# Patient Record
Sex: Male | Born: 1940 | Race: White | Hispanic: No | Marital: Married
Health system: Southern US, Community
[De-identification: ages and names within clinical notes are randomized; demographics above are authoritative.]

---

## 2004-11-15 ENCOUNTER — Ambulatory Visit: Payer: Self-pay | Admitting: Neurology

## 2004-11-19 ENCOUNTER — Ambulatory Visit: Payer: Self-pay | Admitting: Cardiology

## 2005-09-17 ENCOUNTER — Emergency Department: Payer: Self-pay | Admitting: Emergency Medicine

## 2005-11-16 ENCOUNTER — Other Ambulatory Visit: Payer: Self-pay

## 2005-11-16 ENCOUNTER — Inpatient Hospital Stay: Payer: Self-pay | Admitting: Internal Medicine

## 2005-11-22 ENCOUNTER — Ambulatory Visit: Payer: Self-pay | Admitting: Internal Medicine

## 2006-02-11 ENCOUNTER — Encounter: Payer: Self-pay | Admitting: Internal Medicine

## 2006-03-07 ENCOUNTER — Encounter: Payer: Self-pay | Admitting: Internal Medicine

## 2006-04-07 ENCOUNTER — Encounter: Payer: Self-pay | Admitting: Internal Medicine

## 2006-05-07 ENCOUNTER — Encounter: Payer: Self-pay | Admitting: Internal Medicine

## 2006-05-31 ENCOUNTER — Other Ambulatory Visit: Payer: Self-pay

## 2006-05-31 ENCOUNTER — Observation Stay: Payer: Self-pay | Admitting: Internal Medicine

## 2006-06-07 ENCOUNTER — Encounter: Payer: Self-pay | Admitting: Internal Medicine

## 2009-06-06 ENCOUNTER — Ambulatory Visit: Payer: Self-pay | Admitting: Oncology

## 2009-06-25 ENCOUNTER — Ambulatory Visit: Payer: Self-pay | Admitting: Unknown Physician Specialty

## 2009-06-26 ENCOUNTER — Ambulatory Visit: Payer: Self-pay | Admitting: Unknown Physician Specialty

## 2009-07-04 ENCOUNTER — Ambulatory Visit: Payer: Self-pay | Admitting: Radiation Oncology

## 2009-07-06 ENCOUNTER — Ambulatory Visit: Payer: Self-pay | Admitting: Oncology

## 2009-07-10 ENCOUNTER — Ambulatory Visit: Payer: Self-pay | Admitting: Surgery

## 2009-07-11 ENCOUNTER — Ambulatory Visit: Payer: Self-pay | Admitting: Oncology

## 2009-08-06 ENCOUNTER — Ambulatory Visit: Payer: Self-pay | Admitting: Oncology

## 2009-09-06 ENCOUNTER — Ambulatory Visit: Payer: Self-pay | Admitting: Oncology

## 2009-09-08 ENCOUNTER — Inpatient Hospital Stay: Payer: Self-pay | Admitting: Internal Medicine

## 2009-10-06 ENCOUNTER — Ambulatory Visit: Payer: Self-pay | Admitting: Oncology

## 2009-10-29 ENCOUNTER — Ambulatory Visit: Payer: Self-pay | Admitting: Unknown Physician Specialty

## 2009-11-06 ENCOUNTER — Ambulatory Visit: Payer: Self-pay | Admitting: Oncology

## 2009-11-07 ENCOUNTER — Ambulatory Visit: Payer: Self-pay | Admitting: Internal Medicine

## 2009-12-06 ENCOUNTER — Ambulatory Visit: Payer: Self-pay | Admitting: Oncology

## 2009-12-17 ENCOUNTER — Ambulatory Visit: Payer: Self-pay | Admitting: Unknown Physician Specialty

## 2009-12-19 LAB — PATHOLOGY REPORT

## 2010-01-06 ENCOUNTER — Ambulatory Visit: Payer: Self-pay | Admitting: Oncology

## 2010-01-23 ENCOUNTER — Ambulatory Visit: Payer: Self-pay | Admitting: Oncology

## 2010-02-06 ENCOUNTER — Ambulatory Visit: Payer: Self-pay | Admitting: Oncology

## 2010-03-07 ENCOUNTER — Ambulatory Visit: Payer: Self-pay | Admitting: Oncology

## 2010-04-09 ENCOUNTER — Ambulatory Visit: Payer: Self-pay | Admitting: Oncology

## 2010-05-07 ENCOUNTER — Ambulatory Visit: Payer: Self-pay | Admitting: Oncology

## 2010-05-24 ENCOUNTER — Telehealth: Payer: Self-pay

## 2010-05-24 DIAGNOSIS — C159 Malignant neoplasm of esophagus, unspecified: Secondary | ICD-10-CM

## 2010-05-24 NOTE — Telephone Encounter (Signed)
It is ok for him to stay on coumadin for the procedure

## 2010-05-24 NOTE — Telephone Encounter (Addendum)
Pt scheduled for EUS need to review meds and instruct pt  Left message on machine to call back

## 2010-05-24 NOTE — Telephone Encounter (Signed)
Pt scheduled for EUS on 06/27/10 1 pm [name].  Pt needs to pre register before the appt and call 06/26/10 between 1-3 for arrival time.  Need to review meds and instruct pt Neil Brown is opening spot for me to add to EP[facility].   Dr Christella Hartigan this pt is on Coumadin and Plavix are they to stay on them or hold?

## 2010-05-27 NOTE — Telephone Encounter (Signed)
Dr Christella Hartigan the pt reports he is also on Plavix and has a defibrillator.  He is concerned  about  staying on them and having a biopsy done.  Please advise.

## 2010-05-28 NOTE — Telephone Encounter (Signed)
That is all OK,  It he will not be getting FNA or biopsies.  This is just to restage an esophageal cancer

## 2010-05-28 NOTE — Telephone Encounter (Signed)
Pt aware.

## 2010-06-07 ENCOUNTER — Ambulatory Visit: Payer: Self-pay | Admitting: Oncology

## 2010-06-11 ENCOUNTER — Ambulatory Visit: Payer: Self-pay | Admitting: Unknown Physician Specialty

## 2010-06-11 ENCOUNTER — Telehealth: Payer: Self-pay | Admitting: Gastroenterology

## 2010-06-11 NOTE — Telephone Encounter (Signed)
appt cx Nicki [place] at [facility] aware

## 2010-06-11 NOTE — Telephone Encounter (Signed)
Just talked with Dr. Mechele Collin, pt has clear cancer recurrence by EGD today (nearly obstructing).No need for upcoming EUS.  He may require palliative stent placement which I am happy to do (at [facility]). Dr. Mechele Collin will be talking with Dr. Cleotis Lema and will get back to me.

## 2010-06-18 ENCOUNTER — Ambulatory Visit: Payer: Self-pay | Admitting: Oncology

## 2010-06-19 LAB — PATHOLOGY REPORT

## 2010-06-25 ENCOUNTER — Inpatient Hospital Stay: Payer: Self-pay | Admitting: Oncology

## 2010-06-27 ENCOUNTER — Encounter: Payer: Self-pay | Admitting: Gastroenterology

## 2010-07-07 ENCOUNTER — Ambulatory Visit: Payer: Self-pay | Admitting: Oncology

## 2010-08-07 ENCOUNTER — Ambulatory Visit: Payer: Self-pay | Admitting: Oncology

## 2010-08-11 ENCOUNTER — Inpatient Hospital Stay: Payer: Self-pay | Admitting: Internal Medicine

## 2010-08-24 ENCOUNTER — Inpatient Hospital Stay: Payer: Self-pay | Admitting: Unknown Physician Specialty

## 2010-08-29 ENCOUNTER — Ambulatory Visit: Payer: Self-pay | Admitting: Gastroenterology

## 2010-08-30 ENCOUNTER — Inpatient Hospital Stay: Payer: Self-pay | Admitting: Internal Medicine

## 2010-09-07 ENCOUNTER — Ambulatory Visit: Payer: Self-pay | Admitting: Oncology

## 2010-10-07 ENCOUNTER — Ambulatory Visit: Payer: Self-pay | Admitting: Oncology

## 2010-11-07 ENCOUNTER — Ambulatory Visit: Payer: Self-pay | Admitting: Oncology

## 2010-12-07 ENCOUNTER — Ambulatory Visit: Payer: Self-pay | Admitting: Oncology

## 2010-12-19 ENCOUNTER — Observation Stay: Payer: Self-pay | Admitting: Internal Medicine

## 2010-12-23 ENCOUNTER — Ambulatory Visit: Payer: Self-pay | Admitting: Gastroenterology

## 2010-12-26 ENCOUNTER — Ambulatory Visit: Payer: Self-pay | Admitting: Unknown Physician Specialty

## 2011-01-07 ENCOUNTER — Ambulatory Visit: Payer: Self-pay | Admitting: Oncology

## 2011-02-07 DEATH — deceased

## 2011-03-07 DEATH — deceased

## 2011-09-02 IMAGING — CT CT CHEST-ABD W/ CM
1 of 3 series · 13 of 32 positions shown, 18 images · non-contrast
Comparison: None

REASON FOR EXAM: esophageal ulcer   eval malignancy
COMMENTS:

PROCEDURE:     CT  - CT CHEST AND ABDOMEN W  - June 26, 2009  [DATE]
RESULT:     CT CHEST AND ABDOMEN
HISTORY: Esophageal ulcer
TECHNIQUE: Multiple axial images obtained from the thoracic inlet to the
iliac crests, with p.o. contrast and with 100 ml of Dsovue-CHO intravenous
contrast.

[Series 2: soft tissue · axial · 0.79mm/px · z∈[+178,+573]mm · 13 of 95 slices shown, 18 images]
[im 8/95  mediastinal]
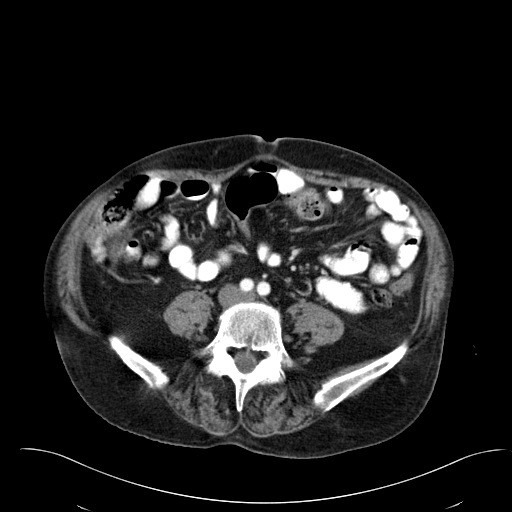
[im 8/95  bone]
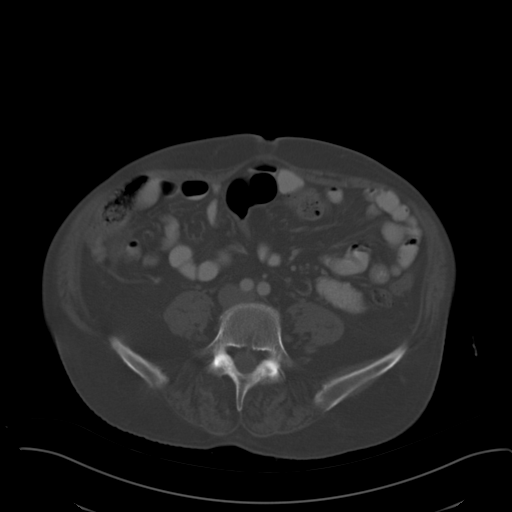
[im 15/95  mediastinal]
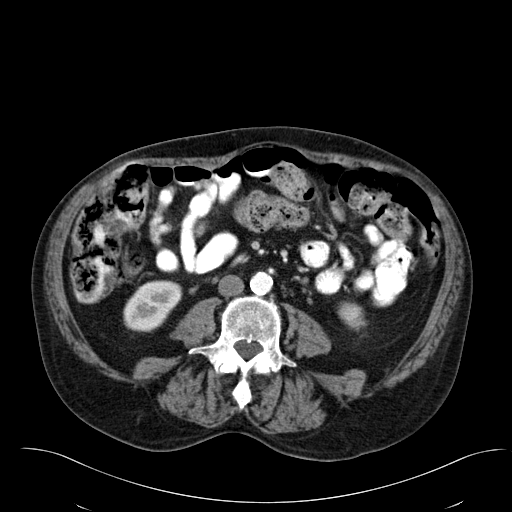
[im 22/95  mediastinal]
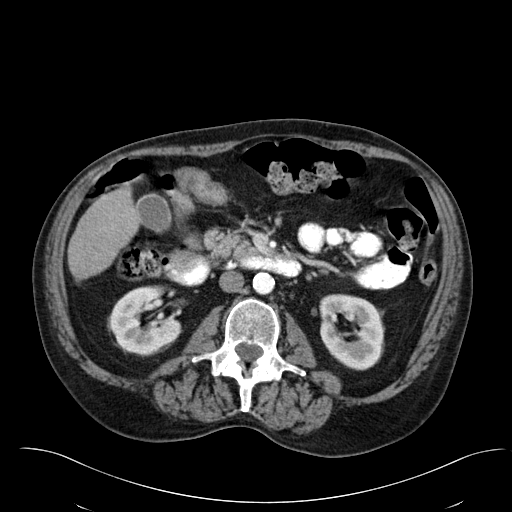
[im 37/95  mediastinal]
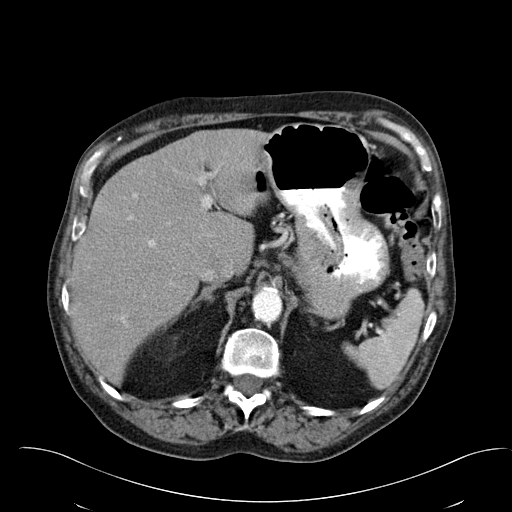
[im 44/95  mediastinal]
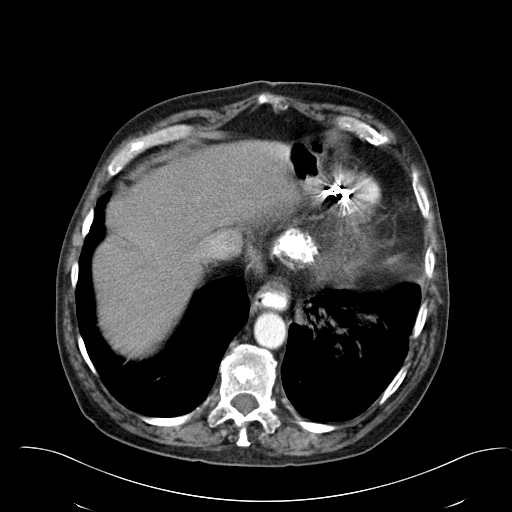
[im 46/95  mediastinal]
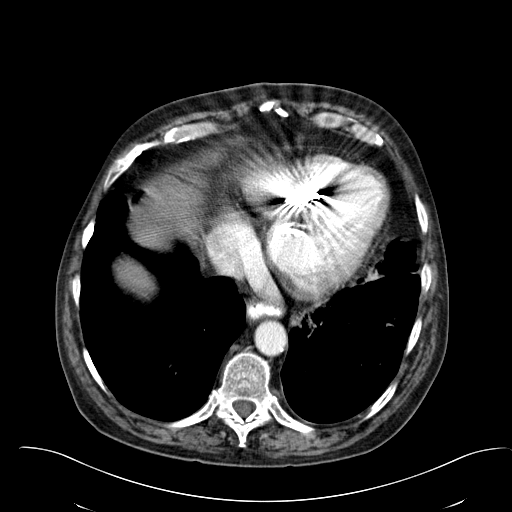
[im 48/95  mediastinal]
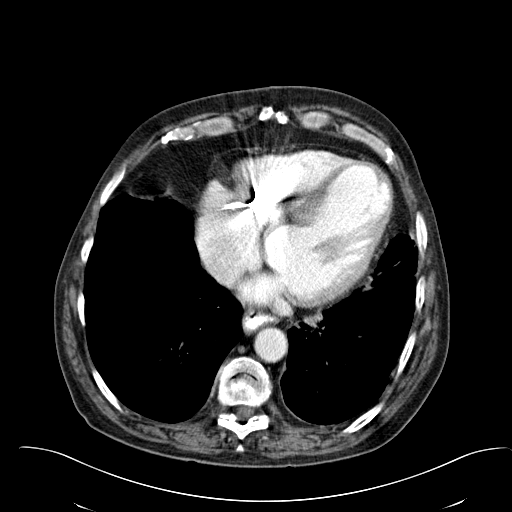
[im 51/95  mediastinal]
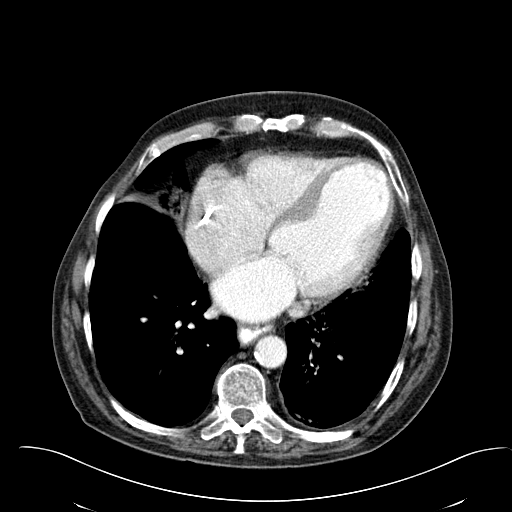
[im 58/95  mediastinal]
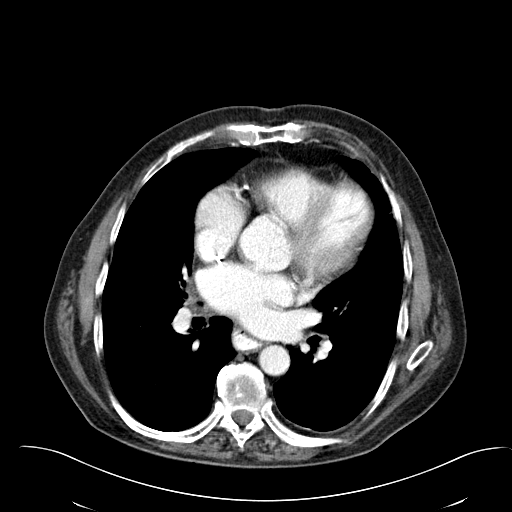
[im 58/95  bone]
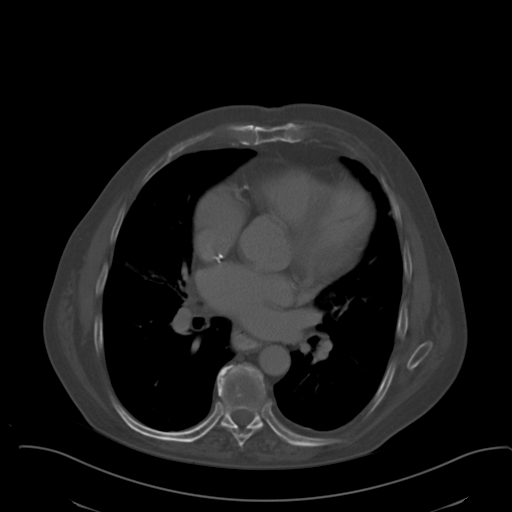
[im 66/95  lung]
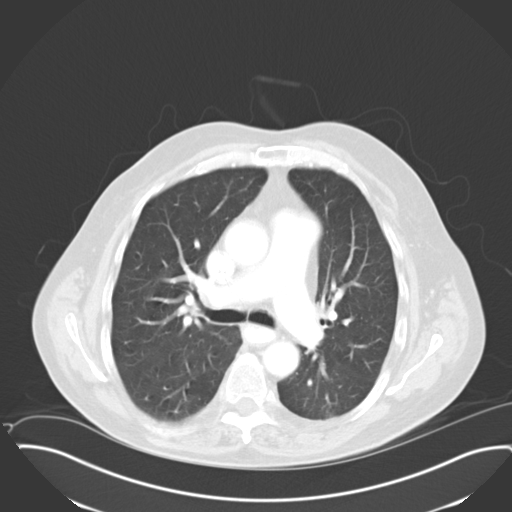
[im 73/95  mediastinal]
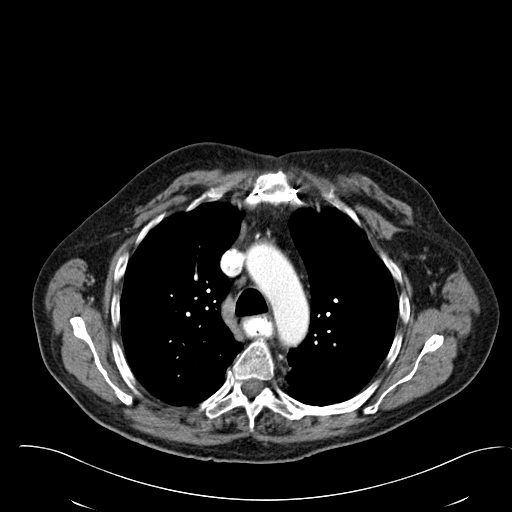
[im 73/95  lung]
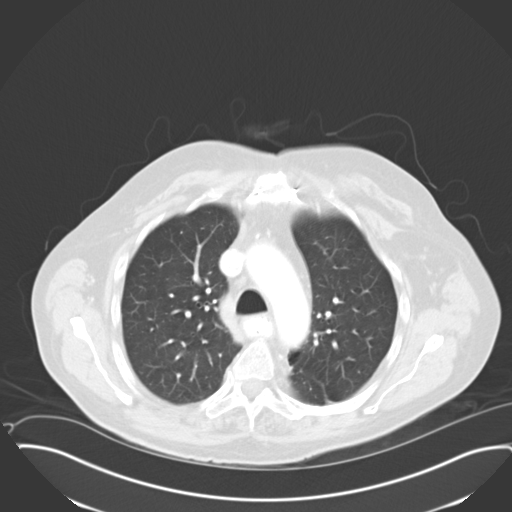
[im 80/95  mediastinal]
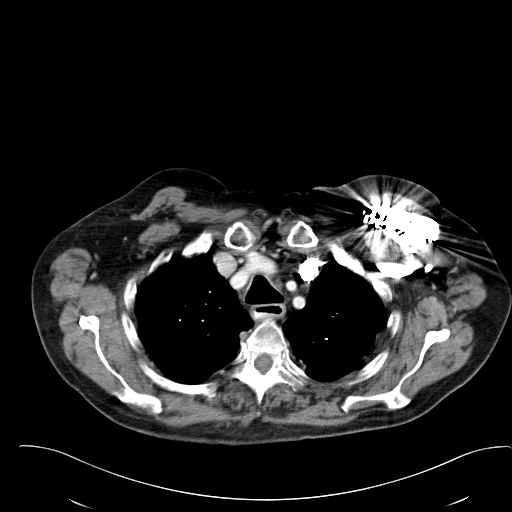
[im 80/95  lung]
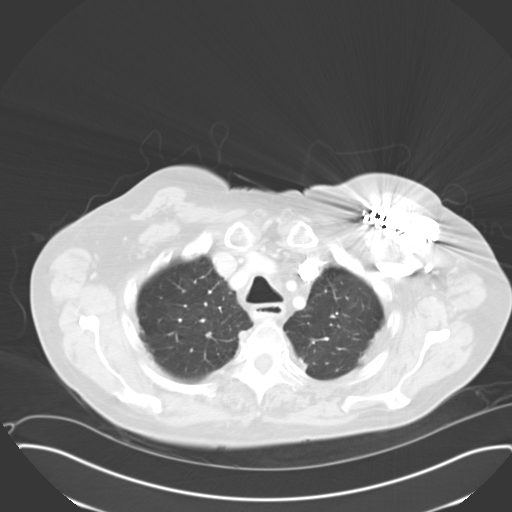
[im 87/95  mediastinal]
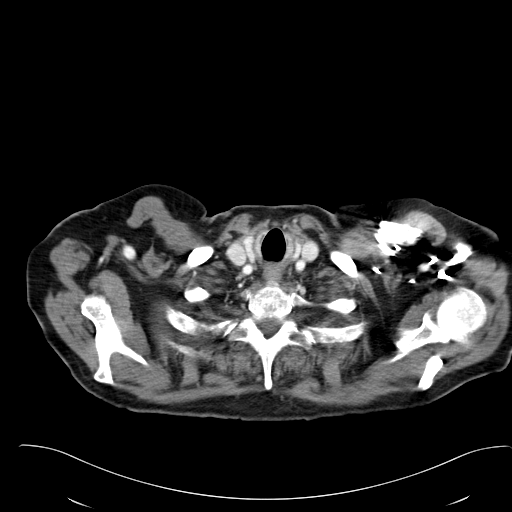
[im 87/95  lung]
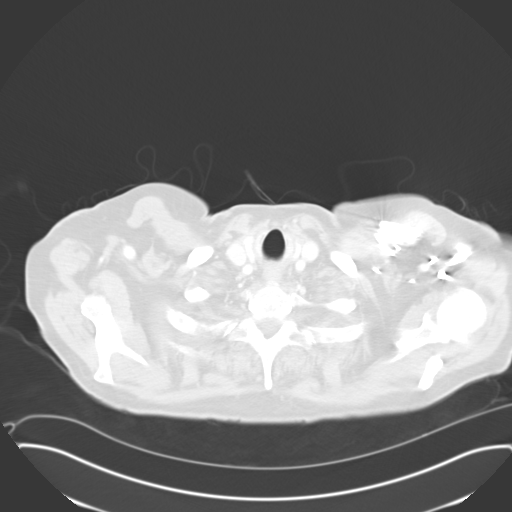

[13 of 32 positions shown; findings below may reference images not displayed]

FINDINGS: CHEST:

 There is right middle lobe airspace disease which may reflect atelectasis
versus infiltrate. There is left basilar fibrosis. There is no focal mass.
There is no pleural effusion, or pneumothorax.

The heart size is enlarged. There is no pericardial effusion. There is
evidence of prior CABG.

There are no pathologically enlarged mediastinal, hilar, or axillary lymph
nodes.

The esophagus is distended with contrast. There is mild esophageal wall
thickening as can be seen with esophagitis.

The osseous structures demonstrate no focal abnormality.

ABDOMEN:

There is a ill-defined hypodense mass in the right hepatic lobe measuring 2
cm x 1.8 cm with a smaller adjacent hypodense lesion measuring 1.5 x 1.5 cm.
There is a small 1.5 cm hypodensity in the inferior right hepatic lobe.
There is no intrahepatic or extrahepatic biliary ductal dilatation. The
gallbladder is unremarkable. The spleen demonstrates no focal abnormality.
The kidneys, adrenal glands, pancreas are normal.

The visualized portions stomach, duodenum, small intestine, and large
intestine demonstrate no contrast extravasation or dilatation. There is no
pneumoperitoneum, pneumatosis, or portal venous gas. There is no abdominal
free fluid. There is no lymphadenopathy.

The abdominal aorta is normal in caliber with atherosclerosis.

The osseous structures are unremarkable.
IMPRESSION: 1. Multiple indeterminate hepatic lesions. Recommend abdominal MRI for
further characterization.

2.  There is right middle lobe airspace disease which may reflect
atelectasis versus infiltrate.

3. The esophagus is distended with contrast. There is mild esophageal wall
thickening as can be seen with esophagitis.

## 2012-08-24 IMAGING — PT NM PET TUM IMG RESTAG (PS) SKULL BASE T - THIGH
1 series · 1 of 1 positions shown · non-contrast
Comparison: none

REASON FOR EXAM: esophageal CA
COMMENTS:

PROCEDURE:     PET - PET/CT RESTG ESOPH CA  - June 18, 2010 [DATE]
RESULT:
HISTORY: Esophageal cancer.

[Series 1007: mm oncology reading_mm oncology reading result ima · 1.31mm/px · 1 of 1 slices shown]
[im 1/1]
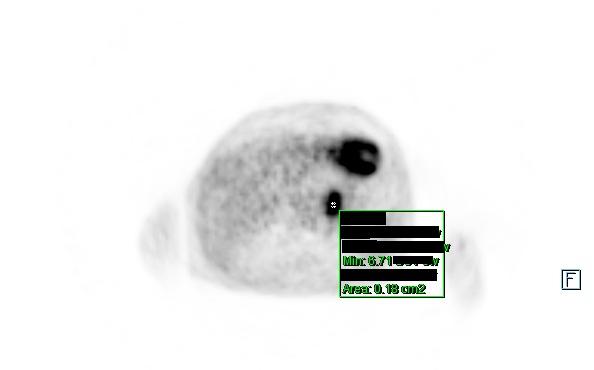

[1 of 1 positions shown; findings below may reference images not displayed]

COMPARISON STUDY:   Prior PET CT of 01/23/2010.

PROCEDURE AND FINDINGS:   Standard PET CT was obtained with 12.32 mCi F-18
FDG. Fasting blood sugar is 110 mg/dL. Comparison is made to a prior study
of 01/23/2010. Again noted is a distal esophageal proximal gastric PET lesion
consistent with the patient's known malignancy. SUV of 7 is noted. This
lesion appears slightly more prominent than on prior exam. The lesion may be
confined to the distal esophagus and stomach. No extrinsic disease is noted.
No evidence of metastatic disease.
IMPRESSION: PET positive distal esophageal proximal gastric lesion that appears to have
increased in size slightly from prior exam. The lesion appears to be
confined to the distal esophagus and stomach.

## 2014-04-30 NOTE — Consult Note (Signed)
PATIENT NAME:  Neil Brown, SKALLA MR#:  [id number] DATE OF BIRTH:  1940/01/24  DATE OF CONSULTATION:  12/19/2010  REFERRING PHYSICIAN:  Dr. Posey Pronto CONSULTING PHYSICIAN:  Manya Silvas, MD  HISTORY OF PRESENT ILLNESS:  The patient is a 74 year old white male with recurrent esophageal cancer, adenocarcinoma, who had an esophageal stent placed 08/2010. He has done very well with this. However, he had an episode of nausea and vomiting that occurred at about 5 or 6:00 a.m. this morning, and he spit up a couple of ounces of blood mixed with stomach juice. Because of the bleeding he came to the hospital and was admitted to the hospital. I was asked to see him in consultation.   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 Denies any abdominal pain. Because he has a stent in place he has reflux, especially if he slides down from the wedge pillow he sleeps on; he will have reflux and slight aspiration. He denies any heart pain. No congestive heart failure symptoms. No fever. No diarrhea. Does have some constipation.   After getting the stent placed he gained 20 pounds, then he lost 10 pounds and has gained 5 pounds back. He overall says he is eating well. He has a feeding tube in place but does not use it.   PAST MEDICAL HISTORY:  Coronary artery disease, previous aneurysm resection, history of congestive heart failure, status post defibrillator implant, history of ventricular tachycardia status post ventricular pacemaker implant, atrial fibrillation on Coumadin, hypertension, degenerative disk disease, prior lumbar laminectomy, and hypertension.   ALLERGIES: None.   CURRENT MEDICATIONS:  1. Albuterol Atrovent nebulizer 4 times a day.  2. Coreg 12.5 mg b.i.d.  3. Coumadin 2.5 mg Saturday and Sunday, 5 mg Monday through Friday.  4. Duragesic patch 12 mcg, two patches every 72 hours. 5. Hydrocodone with acetaminophen 7.5/500 every four hours p.r.n. pain.  6. KCl 20 mEq b.i.d.   7. Magnesium oxide 1 tablet b.i.d.  8. Megace 5 mL daily.  9. Plavix 75 mg a day.  10. Protonix 40 mg a day.  11. Spiriva inhalation daily.  12. Symbicort 2 puffs daily.  13. Ventolin as needed.   HABITS: Used to smoke heavily, occasional alcohol.   PHYSICAL EXAMINATION:  GENERAL: Elderly white male, looks somewhat older than his stated age, looks overall better than he did on his previous admission before his stent was placed.   HEENT: Sclerae anicteric. Conjunctivae negative. Tongue negative.  Head atraumatic.  NECK: The trachea is in the midline.   CHEST: Clear.   HEART: Few irregular beats. No murmurs or gallops I can hear. Somewhat irregular rhythm.   ABDOMEN: Feeding tube is in place.  Abdomen is nondistended. No hepatosplenomegaly. No masses. No bruits.   EXTREMITIES: No edema.   SKIN: Warm and dry. Mood and affect are appropriate.   VITAL SIGNS: Temperature 99.1, pulse 82, blood pressure 110/72, pulse oximetry 95%.   LABORATORY DATA: Glucose 88, BUN 19, creatinine 0.8, sodium 134, potassium 3.4, chloride 98, CO2 28, calcium 8.7, total protein 7.4, albumin 3.4, total bilirubin 0.6, alkaline phosphatase 107, SGOT 10, SGPT 9. White count 8.6, hemoglobin 10, platelet count 381. A- blood with negative antibody screen.  ProTime is 16.2, INR 1.3. Chest x-ray shows cardiomegaly, trace to small bilateral pleural effusions and atelectasis.   ASSESSMENT: Hematemesis in a patient with known esophageal cancer with stent. Possible that he has extension of tumor beyond the stent or that he bled from malignant  tissue near the edges of the stent. Cannot rule out the possibility of new ulcers.   PLAN: The plan is to do an upper endoscopy this afternoon so that we can see for sure what is causing this and decide how soon we can let him go home. 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 He is in agreement.      ____________________________ Manya Silvas,  MD rte:bjt D: 12/19/2010 13:02:11 ET T: 12/19/2010 13:34:20 ET JOB#: [id number]  cc: Manya Silvas, MD, <Dictator> Martie Lee. Oliva Bustard, MD Adin Hector, MD Manya Silvas MD ELECTRONICALLY SIGNED 01/16/2011 12:05
